# Patient Record
Sex: Male | Born: 2005 | Race: White | Hispanic: No | Marital: Single | Smoking: Never smoker
Health system: Southern US, Community
[De-identification: ages and names within clinical notes are randomized; demographics above are authoritative.]

## PROBLEM LIST (undated history)

## (undated) DIAGNOSIS — S3141XA Laceration without foreign body of vagina and vulva, initial encounter: Secondary | ICD-10-CM

---
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
## 2024-01-06 IMAGING — XA DG FLUORO GUIDE NDL PLC/BX
1 series · 1 of 1 positions shown · non-contrast
Comparison: none

CLINICAL DATA: Pain after basketball

EXAM:
EXAM
RIGHT SHOULDER INJECTION UNDER FLUOROSCOPY FOR MRI
FLUOROSCOPY:
Radiation Exposure Index (as provided by the fluoroscopic device):
0.4 mGy air Kerma
TECHNIQUE: The procedure, risks (including but not limited to bleeding,
infection, organ damage ), benefits, and alternatives were explained
to the patient. Questions regarding the procedure were encouraged
and answered. The patient understands and consents to the procedure.

[Series 1: ortho standard · 1 of 1 slices shown]
[im 1/1]
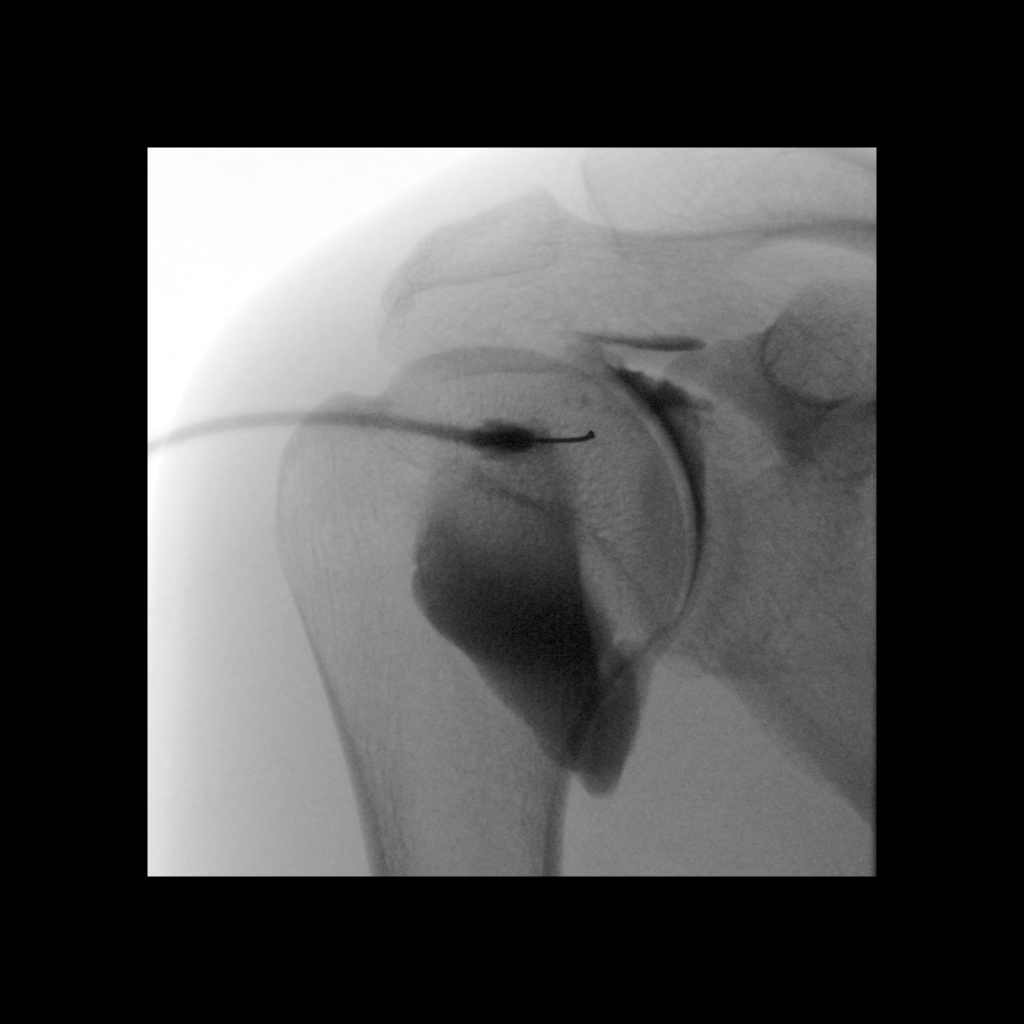

[1 of 1 positions shown; findings below may reference images not displayed]

An appropriate skin entry site was determined under fluoroscopy.
Skin site was marked, prepped with Betadine, and draped in usual
sterile fashion, and infiltrated locally with 1% lidocaine.

22-gauge spinal needle advanced to the superior medial margin of the
humeral head. 1 mL of lidocaine 1% injected easily. 10ml of a
mixture of 20ml dilute iodinated contrast with 0.1ml Multihance
contrast was injected into the shoulder joint. Intraarticular flow
was confirmed on fluoroscopy. Patient transferred to MRI.

COMPLICATIONS:
COMPLICATIONS
none
IMPRESSION: 1. Technically successful right shoulder injection for MRI
